# Patient Record
Sex: Male | Born: 1970 | Race: Black or African American | Hispanic: No | Smoking: Current every day smoker
Health system: Southern US, Community
[De-identification: ages and names within clinical notes are randomized; demographics above are authoritative.]

---

## 2011-06-03 ENCOUNTER — Ambulatory Visit: Payer: Self-pay

## 2011-09-28 ENCOUNTER — Ambulatory Visit: Payer: Self-pay | Admitting: Internal Medicine

## 2011-09-28 VITALS — BP 105/64 | HR 54 | Temp 98.0°F | Resp 16 | Ht 65.5 in | Wt 139.0 lb

## 2011-09-28 DIAGNOSIS — Z0289 Encounter for other administrative examinations: Secondary | ICD-10-CM

## 2012-02-25 NOTE — Progress Notes (Signed)
  Subjective:    Patient ID: Chad Sims, male    DOB: 1971-04-18, 41 y.o.   MRN: [id number]  HPI No current problems see DOT form   Review of Systems C. DOT form    Objective:   Physical Exam  C. DOT form      Assessment & Plan:  Self-pay DOT examination 2  year card

## 2013-07-24 ENCOUNTER — Ambulatory Visit: Payer: Self-pay

## 2013-07-24 LAB — DOT URINE DIP
Blood: NEGATIVE
GLUCOSE, UR: NEGATIVE mg/dL (ref 0–75)
SPECIFIC GRAVITY: 1.02 (ref 1.003–1.030)

## 2016-05-04 ENCOUNTER — Emergency Department
Admission: EM | Admit: 2016-05-04 | Discharge: 2016-05-05 | Disposition: A | Discharge status: Home / Self Care | Payer: Self-pay | Attending: Emergency Medicine | Admitting: Emergency Medicine

## 2016-05-04 ENCOUNTER — Encounter: Payer: Self-pay | Admitting: Emergency Medicine

## 2016-05-04 DIAGNOSIS — F17210 Nicotine dependence, cigarettes, uncomplicated: Secondary | ICD-10-CM | POA: Insufficient documentation

## 2016-05-04 DIAGNOSIS — N3090 Cystitis, unspecified without hematuria: Secondary | ICD-10-CM

## 2016-05-04 DIAGNOSIS — F1290 Cannabis use, unspecified, uncomplicated: Secondary | ICD-10-CM | POA: Insufficient documentation

## 2016-05-04 DIAGNOSIS — N3091 Cystitis, unspecified with hematuria: Secondary | ICD-10-CM | POA: Insufficient documentation

## 2016-05-04 DIAGNOSIS — R319 Hematuria, unspecified: Secondary | ICD-10-CM

## 2016-05-04 DIAGNOSIS — F1490 Cocaine use, unspecified, uncomplicated: Secondary | ICD-10-CM | POA: Insufficient documentation

## 2016-05-04 LAB — URINALYSIS COMPLETE WITH MICROSCOPIC (ARMC ONLY)
BACTERIA UA: NONE SEEN
Bilirubin Urine: NEGATIVE
Glucose, UA: NEGATIVE mg/dL
Leukocytes, UA: NEGATIVE
Nitrite: NEGATIVE
PH: 5 (ref 5.0–8.0)
Protein, ur: NEGATIVE mg/dL
Specific Gravity, Urine: 1.025 (ref 1.005–1.030)

## 2016-05-04 NOTE — ED Triage Notes (Addendum)
Pt presents to ED with abd "soreness" (pain increases after urination), blood in his urine, and blood in his semen since yesterday. Pt reports these symptoms have has occured 3 times over the past year. [facility] he was told he might be having too much sex. Checked for STD's with negative result.

## 2016-05-05 ENCOUNTER — Emergency Department: Payer: Self-pay

## 2016-05-05 LAB — COMPREHENSIVE METABOLIC PANEL
ALBUMIN: 3.6 g/dL (ref 3.5–5.0)
ALT: 17 U/L (ref 17–63)
ANION GAP: 9 (ref 5–15)
AST: 28 U/L (ref 15–41)
Alkaline Phosphatase: 61 U/L (ref 38–126)
BUN: 12 mg/dL (ref 6–20)
CHLORIDE: 101 mmol/L (ref 101–111)
CO2: 25 mmol/L (ref 22–32)
CREATININE: 0.96 mg/dL (ref 0.61–1.24)
Calcium: 9.5 mg/dL (ref 8.9–10.3)
GFR calc non Af Amer: >60 mL/min (ref >60)
Glucose, Bld: 109 mg/dL — ABNORMAL HIGH (ref 65–99)
Potassium: 3.7 mmol/L (ref 3.5–5.1)
SODIUM: 135 mmol/L (ref 135–145)
Total Bilirubin: 0.3 mg/dL (ref 0.3–1.2)
Total Protein: 7.7 g/dL (ref 6.5–8.1)

## 2016-05-05 LAB — CHLAMYDIA/NGC RT PCR (ARMC ONLY)
CHLAMYDIA TR: NOT DETECTED
N gonorrhoeae: NOT DETECTED

## 2016-05-05 LAB — CBC
HCT: 42.2 % (ref 40.0–52.0)
HEMOGLOBIN: 14.3 g/dL (ref 13.0–18.0)
MCH: 31.7 pg (ref 26.0–34.0)
MCHC: 34 g/dL (ref 32.0–36.0)
MCV: 93.3 fL (ref 80.0–100.0)
PLATELETS: 244 10*3/uL (ref 150–440)
RBC: 4.53 MIL/uL (ref 4.40–5.90)
RDW: 14 % (ref 11.5–14.5)
WBC: 5.1 10*3/uL (ref 3.8–10.6)

## 2016-05-05 LAB — CK: CK TOTAL: 211 U/L (ref 49–397)

## 2016-05-05 MED ORDER — CEPHALEXIN 500 MG PO CAPS: 500.0000 mg | ORAL_CAPSULE | Freq: Four times a day (QID) | ORAL | 0 refills | Status: AC

## 2016-05-05 NOTE — Discharge Instructions (Signed)
Please follow up with the acute care clinic for further evaluation. Also please follow-up the nodule noted at the base of the long with repeat imaging in 6 months.

## 2016-05-05 NOTE — ED Provider Notes (Signed)
[facility] Emergency Department Provider Note   ____________________________________________   First MD Initiated Contact with Patient 05/05/16 0028     (approximate)  I have reviewed the triage vital signs and the nursing notes.   HISTORY  Chief Complaint Hematuria    HPI Chad Sims is a 45 y.o. male who comes into the hospital today with blood in his urine. The patient reports that he noticed it yesterday. He reports it was too much and it filled the toilet bowl. The patient states that earlier today it was drops of blood. He has had some back pain and abdominal pain but the pain in his back is closer to his shoulder. The patient reports that this happen before almost a year ago. He was told he was having too much sex as he had been having some blood in his semen as well. The patient was treated then for UTI. He reports this pain is a 4 out of 10 in intensity. The patient denies any fevers, nausea, vomiting. He reports it was hurting to urinate when he had clots in his urine but currently he has no pain when he urinates. He did drink more water today and his urine did clear up from previous. He is here today though for evaluation.   History reviewed. No pertinent past medical history.  There are no active problems to display for this patient.   History reviewed. No pertinent surgical history.  Prior to Admission medications   Medication Sig Start Date End Date Taking? Authorizing Provider  cephALEXin (KEFLEX) 500 MG capsule Take 1 capsule (500 mg total) by mouth 4 (four) times daily. 05/05/16 05/10/16  Rebecka ApleyAllison P Webster, MD    Allergies Patient has no known allergies.  History reviewed. No pertinent family history.  Social History Social History  Substance Use Topics  . Smoking status: Current Every Day Smoker    Packs/day: 0.50    Types: Cigarettes  . Smokeless tobacco: Never Used  . Alcohol use Yes    Review of Systems Constitutional:  No fever/chills Eyes: No visual changes. ENT: No sore throat. Cardiovascular: Denies chest pain. Respiratory: Denies shortness of breath. Gastrointestinal:  abdominal pain.  No nausea, no vomiting.  No diarrhea.  No constipation. Genitourinary: Hematuria Musculoskeletal:  back pain. Skin: Negative for rash. Neurological: Negative for headaches, focal weakness or numbness.  10-point ROS otherwise negative.  ____________________________________________   PHYSICAL EXAM:  VITAL SIGNS: ED Triage Vitals  Enc Vitals Group     BP 05/04/16 2120 114/88     Pulse Rate 05/04/16 2120 69     Resp 05/04/16 2120 18     Temp 05/04/16 2120 98.2 F (36.8 C)     Temp Source 05/04/16 2120 Oral     SpO2 05/04/16 2120 100 %     Weight 05/04/16 2121 140 lb (63.5 kg)     Height 05/04/16 2121 5\' 5"  (1.651 m)     Head Circumference --      Peak Flow --      Pain Score 05/04/16 2121 4     Pain Loc --      Pain Edu? --      Excl. in GC? --     Constitutional: Alert and oriented. Well appearing and in Mild distress. Eyes: Conjunctivae are normal. PERRL. EOMI. Head: Atraumatic. Nose: No congestion/rhinnorhea. Mouth/Throat: Mucous membranes are moist.  Oropharynx non-erythematous. Cardiovascular: Normal rate, regular rhythm. Grossly normal heart sounds.  Good peripheral circulation. Respiratory: Normal respiratory  effort.  No retractions. Lungs CTAB. Gastrointestinal: Soft and nontender. No distention. Positive bowel sounds Musculoskeletal: No lower extremity tenderness nor edema.   Neurologic:  Normal speech and language.  Skin:  Skin is warm, dry and intact. No rash noted. Psychiatric: Mood and affect are normal. Speech and behavior are normal.  ____________________________________________   LABS (all labs ordered are listed, but only abnormal results are displayed)  Labs Reviewed  URINALYSIS COMPLETEWITH MICROSCOPIC ([facility] ONLY) - Abnormal; Notable for the following:       Result Value     Color, Urine YELLOW (*)    APPearance CLEAR (*)    Ketones, ur TRACE (*)    Hgb urine dipstick 1+ (*)    Squamous Epithelial / LPF 0-5 (*)    All other components within normal limits  COMPREHENSIVE METABOLIC PANEL - Abnormal; Notable for the following:    Glucose, Bld 109 (*)    All other components within normal limits  CHLAMYDIA/NGC RT PCR ([facility] ONLY)  CBC  CK   ____________________________________________  EKG  none ____________________________________________  RADIOLOGY  CT renal stone ____________________________________________   PROCEDURES  Procedure(s) performed: None  Procedures  Critical Care performed: No  ____________________________________________   INITIAL IMPRESSION / ASSESSMENT AND PLAN / ED COURSE  Pertinent labs & imaging results that were available during my care of the patient were reviewed by me and considered in my medical decision making (see chart for details).  This is a 45 year old male who comes into the hospital today with hematuria. He did show me a picture that showed T colored urine with clots in the toilet bowl. The patient reports though that his urine does not have that appearance today. I Wilson the patient for a CT scan looking for possible kidney stones as he does have some left-sided abdominal pain. The patient will also receive some blood work looking at his kidney function and CK. I will give the patient a dose of tramadol and I will reassess the patient.  Clinical Course as of May 05 149  Tue May 05, 2016  0148 No renal or ureteral stone or obstruction. Mild bladder wall thickening may indicate cystitis. No acute process otherwise demonstrated in the abdomen or pelvis.  **An incidental finding of potential clinical significance has been found. Nonspecific soft tissue nodule with calcification in the left lung base anteriorly. This could represent focal consolidation, hamartoma, or granulomatous process.  Suggest six-month follow-up.**   CT Renal Soundra PilonStone Study [AW]    Clinical Course User Index [AW] Rebecka ApleyAllison P Webster, MD   The patient's CT shows some thickening of the bladder wall. I will treat the patient with some antibiotics for cystitis. The patient will be discharged home. I will also have him follow-up the soft tissue nodule in his lung and six-month period  ____________________________________________   FINAL CLINICAL IMPRESSION(S) / ED DIAGNOSES  Final diagnoses:  Hematuria, unspecified type  Cystitis      NEW MEDICATIONS STARTED DURING THIS VISIT:  New Prescriptions   CEPHALEXIN (KEFLEX) 500 MG CAPSULE    Take 1 capsule (500 mg total) by mouth 4 (four) times daily.     Note:  This document was prepared using Dragon voice recognition software and may include unintentional dictation errors.    Rebecka ApleyAllison P Webster, MD 05/05/16 [id number]

## 2017-08-08 IMAGING — CT CT RENAL STONE PROTOCOL
2 of 4 series · 16 of 46 positions shown, 18 images · non-contrast
Comparison: None.

CLINICAL DATA: Abdominal soreness with pain increasing after
urination. Complains of blood in the urine and seen in since
yesterday. Patient is head the symptoms 3 times over the past year.
Patient has been evaluated at [HOSPITAL] [HOSPITAL] for similar symptoms.

EXAM:
CT ABDOMEN AND PELVIS WITHOUT CONTRAST
TECHNIQUE: Multidetector CT imaging of the abdomen and pelvis was performed
following the standard protocol without IV contrast.

[Series 2: axial st · axial · 0.69mm/px · z∈[-900,-525]mm · 13 of 83 slices shown, 15 images]
[im 4/83  soft-tissue]
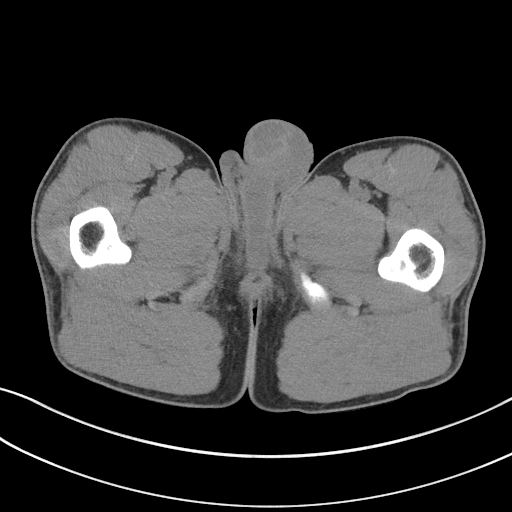
[im 4/83  bone]
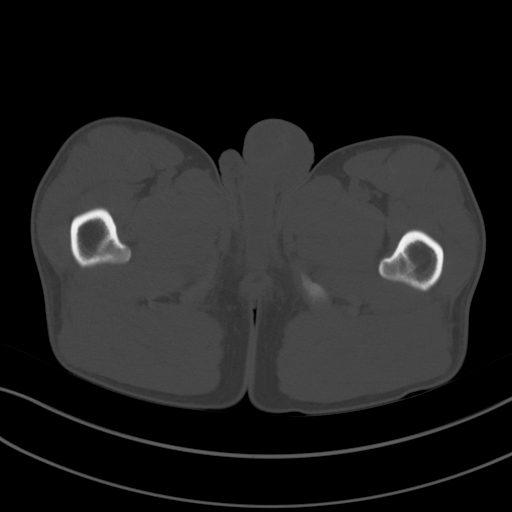
[im 11/83  soft-tissue]
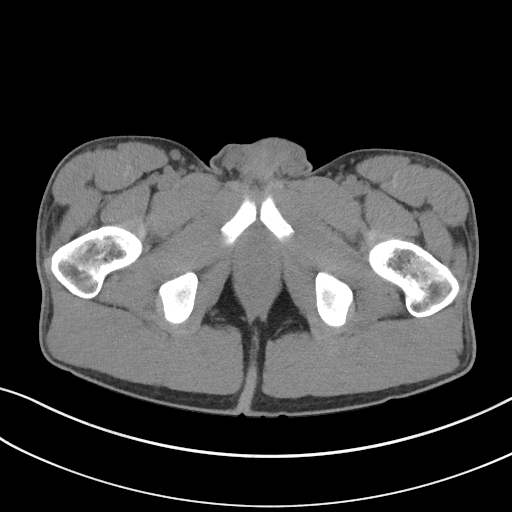
[im 18/83  soft-tissue]
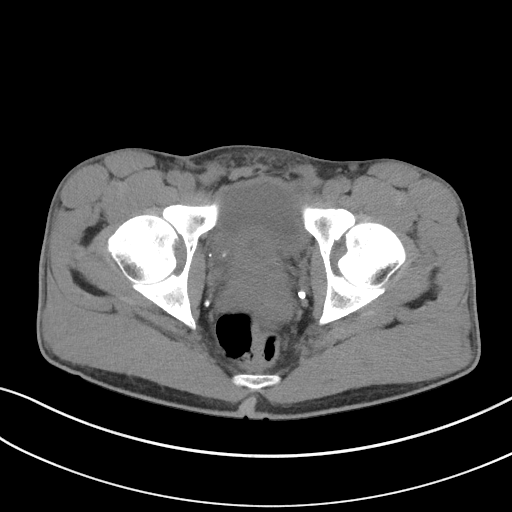
[im 24/83  soft-tissue]
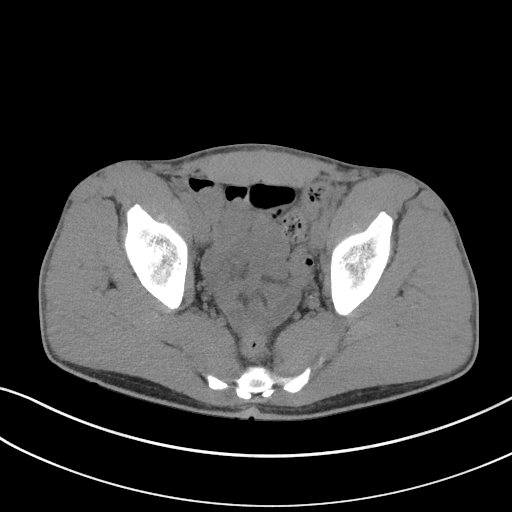
[im 28/83  soft-tissue]
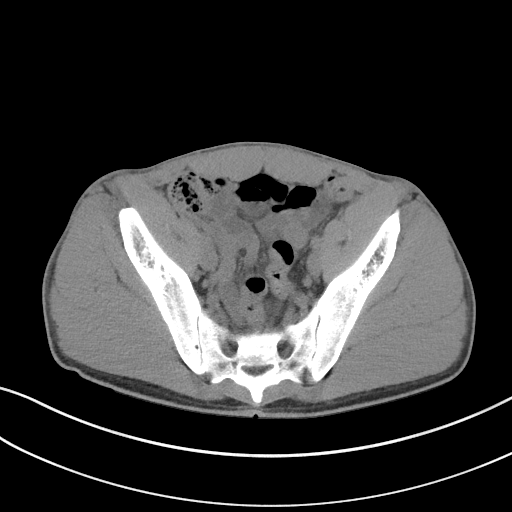
[im 35/83  soft-tissue]
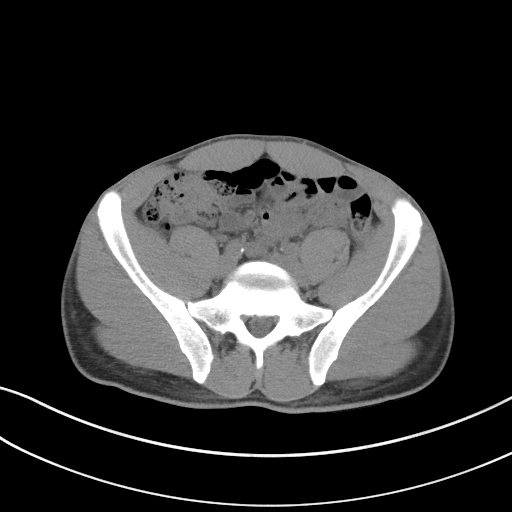
[im 42/83  soft-tissue]
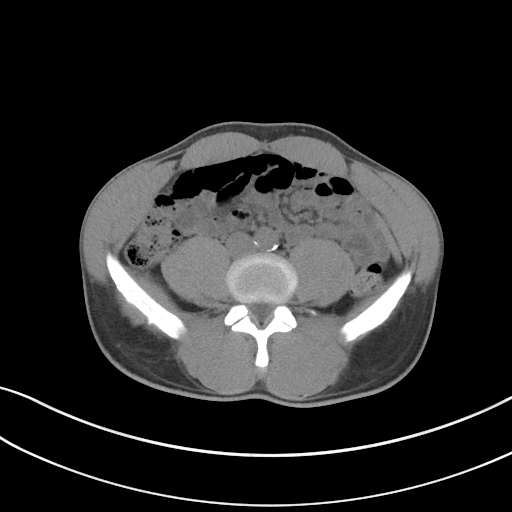
[im 48/83  soft-tissue]
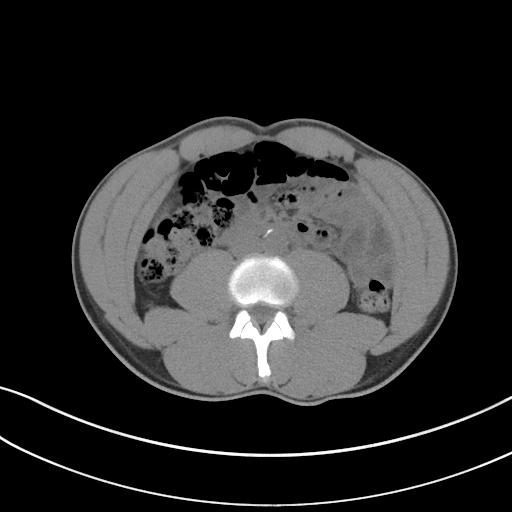
[im 55/83  soft-tissue]
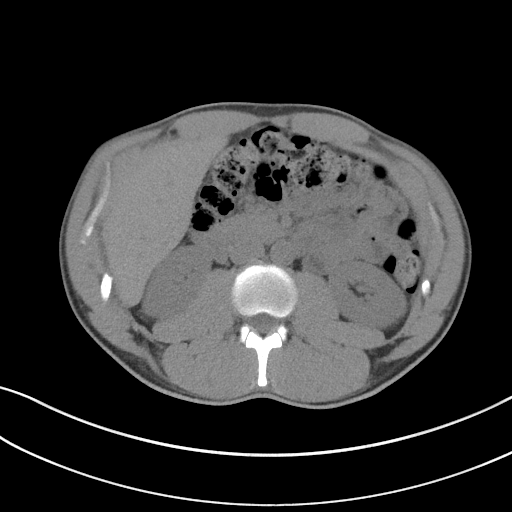
[im 55/83  bone]
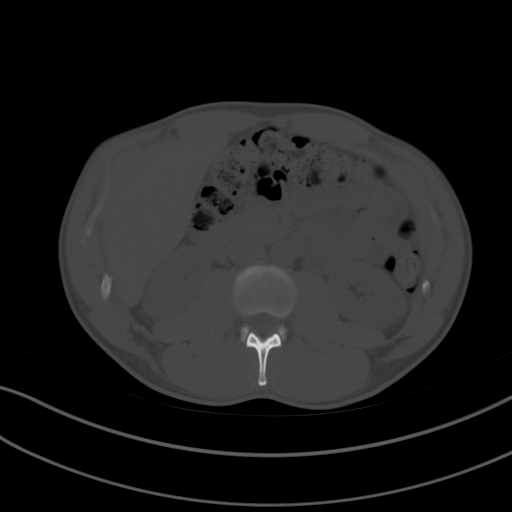
[im 59/83  soft-tissue]
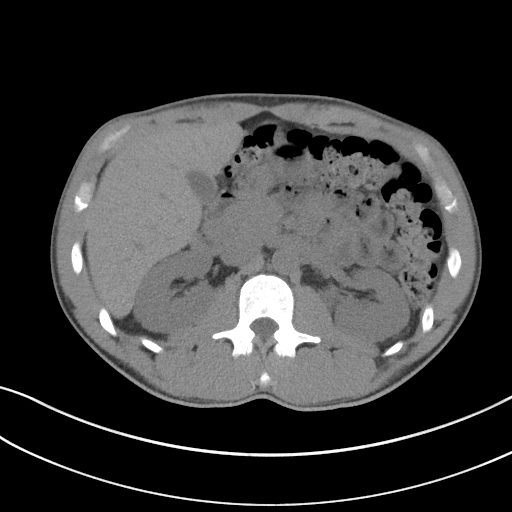
[im 65/83  soft-tissue]
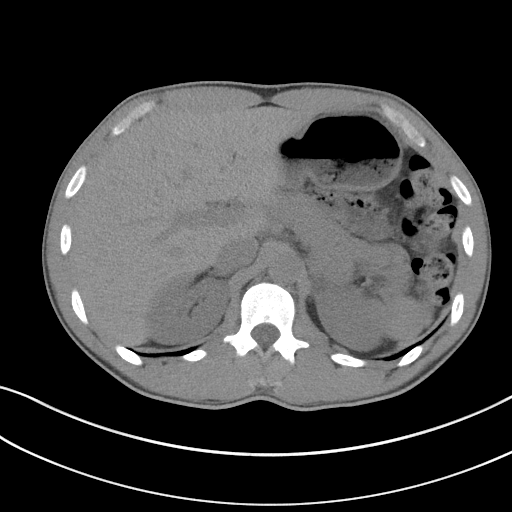
[im 72/83  soft-tissue]
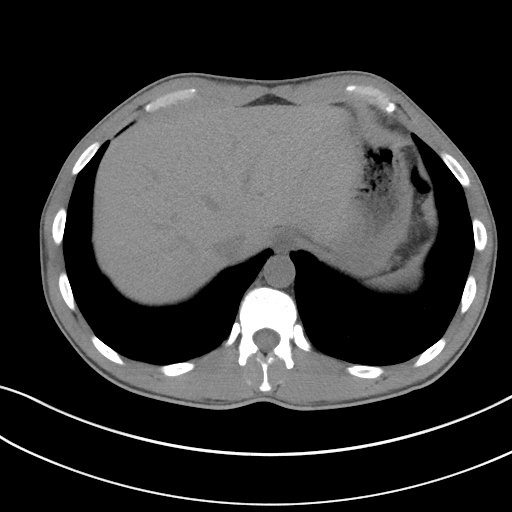
[im 79/83  soft-tissue]
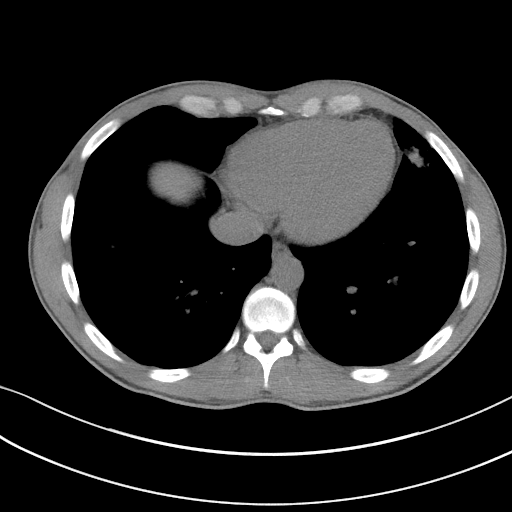

[Series 5: coronal · coronal · 0.65mm/px · 3 of 115 slices shown]
[im 39/115  soft-tissue]
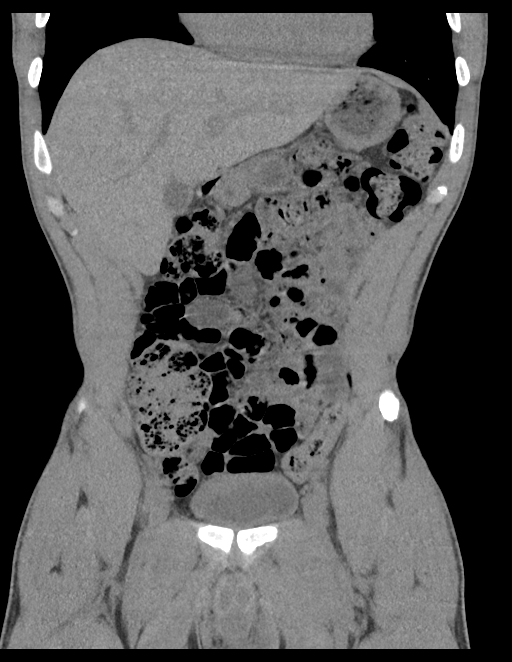
[im 51/115  soft-tissue]
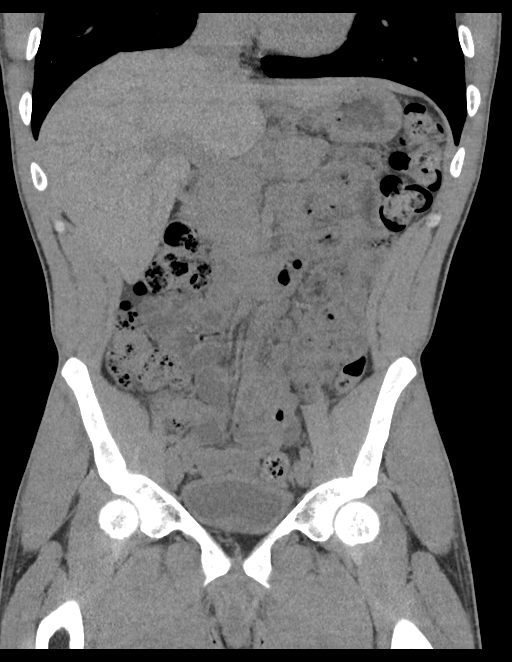
[im 64/115  soft-tissue]
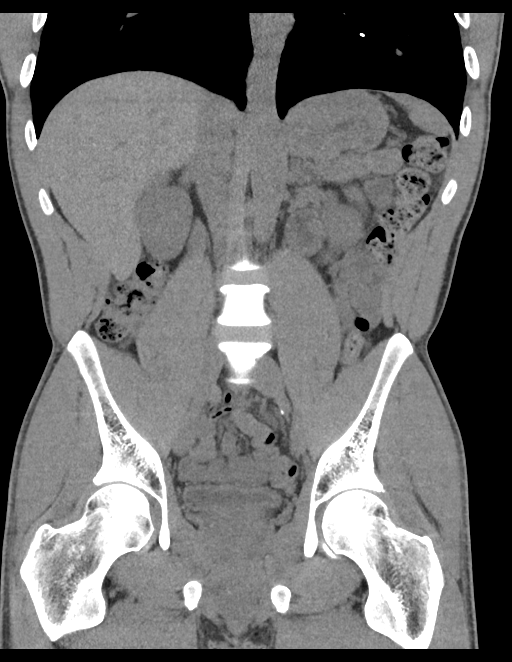

[16 of 46 positions shown; findings below may reference images not displayed]

FINDINGS: Lower chest: Soft tissue nodule in the left lung base anteriorly and
containing calcification. This measures 1.6 x 2.7 cm. This may
represent a hammertoe month or granulomatous process. Suggest
follow-up in 6 months to confirm stability.

Hepatobiliary: No focal liver abnormality is seen. No gallstones,
gallbladder wall thickening, or biliary dilatation.

Pancreas: Unremarkable. No pancreatic ductal dilatation or
surrounding inflammatory changes.

Spleen: Normal in size without focal abnormality.

Adrenals/Urinary Tract: No adrenal gland nodules. Kidneys appear
symmetrical. No hydronephrosis or hydroureter. No urinary tract
stones identified. Bladder wall is mildly thickened which could
indicate cystitis.

Stomach/Bowel: Stomach is within normal limits. Appendix appears
normal. No evidence of bowel wall thickening, distention, or
inflammatory changes.

Vascular/Lymphatic: Aortic atherosclerosis. No enlarged abdominal or
pelvic lymph nodes.

Reproductive: Prostate is unremarkable.

Other: No abdominal wall hernia or abnormality. No abdominopelvic
ascites.

Musculoskeletal: No acute or significant osseous findings.
IMPRESSION: No renal or ureteral stone or obstruction. Mild bladder wall
thickening may indicate cystitis. No acute process otherwise
demonstrated in the abdomen or pelvis.

**An incidental finding of potential clinical significance has been
found. Nonspecific soft tissue nodule with calcification in the left
lung base anteriorly. This could represent focal consolidation,
hamartoma, or granulomatous process. Suggest six-month follow-up.**

## 2018-09-09 ENCOUNTER — Encounter (HOSPITAL_COMMUNITY): Payer: Self-pay | Admitting: Emergency Medicine

## 2018-09-09 ENCOUNTER — Emergency Department (HOSPITAL_COMMUNITY)
Admission: EM | Admit: 2018-09-09 | Discharge: 2018-09-09 | Disposition: A | Discharge status: Home / Self Care | Payer: Self-pay | Attending: Emergency Medicine | Admitting: Emergency Medicine

## 2018-09-09 ENCOUNTER — Other Ambulatory Visit: Payer: Self-pay

## 2018-09-09 DIAGNOSIS — F17210 Nicotine dependence, cigarettes, uncomplicated: Secondary | ICD-10-CM | POA: Insufficient documentation

## 2018-09-09 DIAGNOSIS — J069 Acute upper respiratory infection, unspecified: Secondary | ICD-10-CM | POA: Insufficient documentation

## 2018-09-09 MED ORDER — BENZONATATE 100 MG PO CAPS: 100.0000 mg | ORAL_CAPSULE | Freq: Three times a day (TID) | ORAL | 0 refills | Status: AC

## 2018-09-09 MED ORDER — FLUTICASONE PROPIONATE 50 MCG/ACT NA SUSP: 1.0000 | Freq: Every day | NASAL | 2 refills | Status: AC

## 2018-09-09 NOTE — ED Triage Notes (Signed)
Pt reports a cough, congestion, and aches. Pt denies any fevers. Pt reports his wife has similar symptoms so they decided to come in together.

## 2018-09-09 NOTE — ED Provider Notes (Signed)
[facility] EMERGENCY DEPARTMENT Provider Note   CSN: [id number] Arrival date & time: 09/09/18  8099    History   Chief Complaint Flu like symptoms   HPI Chad Sims is a 48 y.o. male with past medical history significant for tobacco use who presents for evaluation of flulike symptoms.  Patient states he has had congestion, rhinorrhea, productive cough, body aches and pains onset Monday.  Patient states he did have a sore throat on Tuesday, however this has since resolved.  Patient is been able to tolerate p.o. intake without difficulty.  Denies fever, chills, nausea, vomiting, chest pain, shortness of breath, abdominal pain, diarrhea or dysuria.  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  Patient is concerned that he could possibly have coronavirus.  He has not had any recent travel or known contacts with coronavirus positive patients that he knows of.  Not take anything for symptoms PTA.  Denies additional aggravating or alleviating factors. Denies immunocompromised state.  History obtained from patient.  No interpreter was used.     HPI  History reviewed. No pertinent past medical history.  There are no active problems to display for this patient.   History reviewed. No pertinent surgical history.      Home Medications    Prior to Admission medications   Medication Sig Start Date End Date Taking? Authorizing Provider  benzonatate (TESSALON) 100 MG capsule Take 1 capsule (100 mg total) by mouth every 8 (eight) hours. 09/09/18   ,  A, PA-C  fluticasone (FLONASE) 50 MCG/ACT nasal spray Place 1 spray into both nostrils daily. 09/09/18   ,  A, PA-C    Family History No family history on file.  Social History Social History   Tobacco Use  . Smoking status: Current Every Day Smoker    Packs/day: 0.50    Types: Cigarettes  . Smokeless tobacco: Never Used  Substance Use Topics  . Alcohol  use: Yes  . Drug use: Yes    Types: Marijuana, Cocaine     Allergies   Patient has no known allergies.   Review of Systems Review of Systems  Constitutional: Negative.   HENT: Positive for congestion, postnasal drip, rhinorrhea and sore throat. Negative for sinus pressure, sinus pain, sneezing, trouble swallowing and voice change.   Eyes: Negative.   Respiratory: Positive for cough. Negative for apnea, choking, chest tightness, shortness of breath, wheezing and stridor.   Cardiovascular: Negative.   Gastrointestinal: Negative.   Genitourinary: Negative.   Musculoskeletal: Negative.   Skin: Negative.   Neurological: Negative.   All other systems reviewed and are negative.    Physical Exam Updated Vital Signs BP 125/90 (BP Location: Right Arm)   Pulse 73   Temp 98.2 F (36.8 C) (Oral)   Resp 16   Ht 5\' 5"  (1.651 m)   Wt 65.8 kg   SpO2 100%   BMI 24.13 kg/m   Physical Exam Vitals signs and nursing note reviewed.  Constitutional:      General: He is not in acute distress.    Appearance: He is not ill-appearing, toxic-appearing or diaphoretic.  HENT:     Head: Normocephalic and atraumatic.     Jaw: There is normal jaw occlusion.     Right Ear: Tympanic membrane, ear canal and external ear normal. There is no impacted cerumen. No hemotympanum. Tympanic membrane is not injected, scarred, perforated, erythematous, retracted or bulging.     Left Ear:  Tympanic membrane, ear canal and external ear normal. There is no impacted cerumen. No hemotympanum. Tympanic membrane is not injected, scarred, perforated, erythematous, retracted or bulging.     Ears:     Comments: No Mastoid tenderness.    Nose:     Comments: Clear rhinorrhea and congestion to bilateral nares.  No sinus tenderness.    Mouth/Throat:     Comments: Posterior oropharynx clear.  Mucous membranes moist.  Tonsils without erythema or exudate.  Uvula midline without deviation.  No evidence of PTA or RPA.  No  drooling, dysphasia or trismus.  Phonation normal. Neck:     Trachea: Trachea and phonation normal.     Meningeal: Brudzinski's sign and Kernig's sign absent.     Comments: No Neck stiffness or neck rigidity.  No meningismus.  No cervical lymphadenopathy. Cardiovascular:     Comments: No murmurs rubs or gallops. Pulmonary:     Comments: Clear to auscultation bilaterally without wheeze, rhonchi or rales.  No accessory muscle usage.  Able speak in full sentences. Abdominal:     Comments: Soft, nontender without rebound or guarding.  No CVA tenderness.  Musculoskeletal:     Comments: Moves all 4 extremities without difficulty.  Lower extremities without edema, erythema or warmth.  Skin:    Comments: Brisk capillary refill.  No rashes or lesions.  Neurological:     Mental Status: He is alert.     Comments: Ambulatory in department without difficulty.  Cranial nerves II through XII grossly intact.  No facial droop.  No aphasia.      ED Treatments / Results  Labs (all labs ordered are listed, but only abnormal results are displayed) Labs Reviewed - No data to display  EKG None  Radiology No results found.  Procedures Procedures (including critical care time)  Medications Ordered in ED Medications - No data to display   Initial Impression / Assessment and Plan / ED Course  I have reviewed the triage vital signs and the nursing notes.  Pertinent labs & imaging results that were available during my care of the patient were reviewed by me and considered in my medical decision making (see chart for details).  51- year old male appears otherwise well presents for evaluation of flulike symptoms.  Afebrile, nonseptic, non-ill-appearing.  Patient with nonproductive cough, congestion, rhinorrhea, sore throat as well as body aches and pains.  Is not take anything for symptoms PTA.  Lungs clear to auscultation bilateral without wheeze, rhonchi or rales.  No tachypnea, tachycardia or  hypoxia, low suspicion for pneumonia.  He is able to speak in full sentences with difficulty.  Denies chest pain or shortness of breath.  Posterior oropharynx clear.  Neck without erythema or exudate.  No drooling, dysphasia or trismus.  No evidence of PTA or RPA.  Tolerating p.o. intake at home without difficulty.  He has no signs of dehydration.  Vital signs are stable.  No neck stiffness or neck rigidity, no meningismus, low suspicion for meningitis.  Discussed with patient that he does not meet current CDC criteria for testing for coronavirus at this time.  Discussed home isolation until 72 hours without fever for 7 days from onset of symptoms.  Given patient work note.  Discussed signs and symptoms which would cause patient to return to emergency department.  Patient given symptomatic management.  Patient hemodynamically stable and appropriate for DC at this time. Patient voices understanding of return precautions and is agreeable for follow-up.      Chad Sims  D Joseph ArtWoods was evaluated in Emergency Department on 09/09/2018 for the symptoms described in the history of present illness. He was evaluated in the context of the global COVID-19 pandemic, which necessitated consideration that the patient might be at risk for infection with the SARS-CoV-2 virus that causes COVID-19. Institutional protocols and algorithms that pertain to the evaluation of patients at risk for COVID-19 are in a state of rapid change based on information released by regulatory bodies including the CDC and federal and state organizations. These policies and algorithms were followed during the patient's care in the ED. Final Clinical Impressions(s) / ED Diagnoses   Final diagnoses:  Viral URI    ED Discharge Orders         Ordered    benzonatate (TESSALON) 100 MG capsule  Every 8 hours     09/09/18 1045    fluticasone (FLONASE) 50 MCG/ACT nasal spray  Daily     09/09/18 1045           ,  A, PA-C 09/09/18 1059     Arby BarrettePfeiffer, Marcy, MD 09/10/18 1107

## 2018-09-09 NOTE — ED Notes (Signed)
Patient verbalizes understanding of discharge instructions. Opportunity for questioning and answering were provided.  patient discharged from ED.  

## 2018-09-09 NOTE — Discharge Instructions (Signed)
You were evaluated today for upper respiratory symptoms.  Possible given your symptoms you do have a coronavirus.  We suggest home isolation for 7 days from onset of symptoms or 3 days of fever free.  I have written a work note.  Please follow-up for reevaluation  Coronavirus (COVID-19) Are you at risk?  Are you at risk for the Coronavirus (COVID-19)?  To be considered HIGH RISK for Coronavirus (COVID-19), you have to meet the following criteria:  Traveled to [place], [place], [place], [place] or [place]; or in the [place] to [place], [place], [place], or [place]; and have fever, cough, and shortness of breath within the last 2 weeks of travel OR Been in close contact with a person diagnosed with COVID-19 within the last 2 weeks and have fever, cough, and shortness of breath IF YOU DO NOT MEET THESE CRITERIA, YOU ARE CONSIDERED LOW RISK FOR COVID-19.  What to do if you are HIGH RISK for COVID-19?  If you are having a medical emergency, call 911. Seek medical care right away. Before you go to a doctors office, urgent care or emergency department, call ahead and tell them about your recent travel, contact with someone diagnosed with COVID-19, and your symptoms. You should receive instructions from your physicians office regarding next steps of care.  When you arrive at healthcare provider, tell the healthcare staff immediately you have returned from visiting [place], [place], [place], [place] or [place]; or traveled in the [place] to [place], [place], [place], or [place]; in the last two weeks or you have been in close contact with a person diagnosed with COVID-19 in the last 2 weeks.   Tell the health care staff about your symptoms: fever, cough and shortness of breath. After you have been seen by a medical provider, you will be either: Tested for (COVID-19) and discharged home on quarantine except to seek medical care if symptoms worsen, and asked to  Stay home and avoid  contact with others until you get your results (4-5 days)  Avoid travel on public transportation if possible (such as bus, train, or airplane) or Sent to the Emergency Department by EMS for evaluation, COVID-19 testing, and possible admission depending on your condition and test results.  What to do if you are LOW RISK for COVID-19?  Reduce your risk of any infection by using the same precautions used for avoiding the common cold or flu:  Wash your hands often with soap and warm water for at least 20 seconds.  If soap and water are not readily available, use an alcohol-based hand sanitizer with at least 60% alcohol.  If coughing or sneezing, cover your mouth and nose by coughing or sneezing into the elbow areas of your shirt or coat, into a tissue or into your sleeve (not your hands). Avoid shaking hands with others and consider head nods or verbal greetings only. Avoid touching your eyes, nose, or mouth with unwashed hands.  Avoid close contact with people who are sick. Avoid places or events with large numbers of people in one location, like concerts or sporting events. Carefully consider travel plans you have or are making. If you are planning any travel outside or inside the [place], visit the [facility] [employer] webpage for the latest health notices. If you have some symptoms but not all symptoms, continue to monitor at home and seek medical attention if your symptoms worsen. If you are having a medical emergency, call 911.   ADDITIONAL  HEALTHCARE OPTIONS FOR PATIENTS  Combine Telehealth / e-Visit: [email or web address]

## 2023-05-18 ENCOUNTER — Other Ambulatory Visit: Payer: Self-pay | Admitting: Orthopedic Surgery

## 2023-05-18 ENCOUNTER — Ambulatory Visit
Admission: RE | Admit: 2023-05-18 | Discharge: 2023-05-18 | Disposition: A | Discharge status: Home / Self Care | Payer: 59 | Attending: Orthopedic Surgery | Admitting: Orthopedic Surgery

## 2023-05-18 ENCOUNTER — Ambulatory Visit
Admission: RE | Admit: 2023-05-18 | Discharge: 2023-05-18 | Disposition: A | Discharge status: Home / Self Care | Payer: 59 | Source: Ambulatory Visit | Attending: Orthopedic Surgery | Admitting: Orthopedic Surgery

## 2023-05-18 DIAGNOSIS — X500XXD Overexertion from strenuous movement or load, subsequent encounter: Secondary | ICD-10-CM | POA: Diagnosis not present

## 2023-05-18 DIAGNOSIS — M1712 Unilateral primary osteoarthritis, left knee: Secondary | ICD-10-CM | POA: Diagnosis present

## 2023-05-18 DIAGNOSIS — M21162 Varus deformity, not elsewhere classified, left knee: Secondary | ICD-10-CM | POA: Insufficient documentation

## 2023-05-18 DIAGNOSIS — S82142G Displaced bicondylar fracture of left tibia, subsequent encounter for closed fracture with delayed healing: Secondary | ICD-10-CM | POA: Diagnosis not present
